# Patient Record
Sex: Female | Born: 2000 | Race: Black or African American | Hispanic: No | Marital: Single | Smoking: Never smoker
Health system: Southern US, Community
[De-identification: ages and names within clinical notes are randomized; demographics above are authoritative.]

---

## 2020-01-21 ENCOUNTER — Other Ambulatory Visit: Payer: Self-pay

## 2020-01-21 ENCOUNTER — Emergency Department (HOSPITAL_COMMUNITY): Payer: HRSA Program

## 2020-01-21 ENCOUNTER — Emergency Department (HOSPITAL_COMMUNITY)
Admission: EM | Admit: 2020-01-21 | Discharge: 2020-01-21 | Disposition: A | Discharge status: Home / Self Care | Payer: HRSA Program | Attending: Emergency Medicine | Admitting: Emergency Medicine

## 2020-01-21 ENCOUNTER — Encounter (HOSPITAL_COMMUNITY): Payer: Self-pay | Admitting: *Deleted

## 2020-01-21 DIAGNOSIS — R112 Nausea with vomiting, unspecified: Secondary | ICD-10-CM | POA: Insufficient documentation

## 2020-01-21 DIAGNOSIS — R05 Cough: Secondary | ICD-10-CM | POA: Insufficient documentation

## 2020-01-21 DIAGNOSIS — U071 COVID-19: Secondary | ICD-10-CM | POA: Diagnosis not present

## 2020-01-21 DIAGNOSIS — R531 Weakness: Secondary | ICD-10-CM | POA: Insufficient documentation

## 2020-01-21 DIAGNOSIS — R0600 Dyspnea, unspecified: Secondary | ICD-10-CM | POA: Insufficient documentation

## 2020-01-21 DIAGNOSIS — R509 Fever, unspecified: Secondary | ICD-10-CM | POA: Insufficient documentation

## 2020-01-21 DIAGNOSIS — R079 Chest pain, unspecified: Secondary | ICD-10-CM | POA: Diagnosis present

## 2020-01-21 LAB — I-STAT BETA HCG BLOOD, ED (MC, WL, AP ONLY): I-stat hCG, quantitative: <5 m[IU]/mL (ref <5)

## 2020-01-21 LAB — CBC WITH DIFFERENTIAL/PLATELET
Abs Immature Granulocytes: 0.05 10*3/uL (ref 0.00–0.07)
Basophils Absolute: 0 10*3/uL (ref 0.0–0.1)
Basophils Relative: 0 %
Eosinophils Absolute: 0 10*3/uL (ref 0.0–0.5)
Eosinophils Relative: 0 %
HCT: 40.7 % (ref 36.0–46.0)
Hemoglobin: 13.8 g/dL (ref 12.0–15.0)
Immature Granulocytes: 1 %
Lymphocytes Relative: 11 %
Lymphs Abs: 1 10*3/uL (ref 0.7–4.0)
MCH: 29.7 pg (ref 26.0–34.0)
MCHC: 33.9 g/dL (ref 30.0–36.0)
MCV: 87.7 fL (ref 80.0–100.0)
Monocytes Absolute: 0.4 10*3/uL (ref 0.1–1.0)
Monocytes Relative: 4 %
Neutro Abs: 8.2 10*3/uL — ABNORMAL HIGH (ref 1.7–7.7)
Neutrophils Relative %: 84 %
Platelets: 189 10*3/uL (ref 150–400)
RBC: 4.64 MIL/uL (ref 3.87–5.11)
RDW: 11.9 % (ref 11.5–15.5)
WBC: 9.7 10*3/uL (ref 4.0–10.5)
nRBC: 0 % (ref 0.0–0.2)

## 2020-01-21 LAB — COMPREHENSIVE METABOLIC PANEL
ALT: 47 U/L — ABNORMAL HIGH (ref 0–44)
AST: 37 U/L (ref 15–41)
Albumin: 3.8 g/dL (ref 3.5–5.0)
Alkaline Phosphatase: 38 U/L (ref 38–126)
Anion gap: 8 (ref 5–15)
BUN: 9 mg/dL (ref 6–20)
CO2: 27 mmol/L (ref 22–32)
Calcium: 8.9 mg/dL (ref 8.9–10.3)
Chloride: 101 mmol/L (ref 98–111)
Creatinine, Ser: 1.05 mg/dL — ABNORMAL HIGH (ref 0.44–1.00)
GFR calc Af Amer: >60 mL/min (ref >60)
GFR calc non Af Amer: >60 mL/min (ref >60)
Glucose, Bld: 105 mg/dL — ABNORMAL HIGH (ref 70–99)
Potassium: 3.5 mmol/L (ref 3.5–5.1)
Sodium: 136 mmol/L (ref 135–145)
Total Bilirubin: 0.4 mg/dL (ref 0.3–1.2)
Total Protein: 7.9 g/dL (ref 6.5–8.1)

## 2020-01-21 LAB — BRAIN NATRIURETIC PEPTIDE: B Natriuretic Peptide: 27.2 pg/mL (ref 0.0–100.0)

## 2020-01-21 MED ORDER — KETOROLAC TROMETHAMINE 15 MG/ML IJ SOLN
15.0000 mg | Freq: Once | INTRAMUSCULAR | Status: AC
  Administered 2020-01-21: 15 mg via INTRAVENOUS
  Filled 2020-01-21: qty 1

## 2020-01-21 MED ORDER — SODIUM CHLORIDE 0.9 % IV BOLUS
1000.0000 mL | Freq: Once | INTRAVENOUS | Status: AC
  Administered 2020-01-21: 1000 mL via INTRAVENOUS

## 2020-01-21 MED ORDER — ACETAMINOPHEN 325 MG PO TABS: 650.0000 mg | ORAL_TABLET | Freq: Once | ORAL | Status: DC | PRN

## 2020-01-21 MED ORDER — IOHEXOL 350 MG/ML SOLN
100.0000 mL | Freq: Once | INTRAVENOUS | Status: AC | PRN
  Administered 2020-01-21: 100 mL via INTRAVENOUS

## 2020-01-21 NOTE — Discharge Instructions (Signed)
As discussed, your evaluation today has been largely reassuring.  But, it is important that you monitor your condition carefully, and do not hesitate to return to the ED if you develop new, or concerning changes in your condition. ? ?Otherwise, please follow-up with your physician for appropriate ongoing care. ? ?

## 2020-01-21 NOTE — ED Triage Notes (Signed)
Pt arrives via [facility] with c/o chest pain. Per report, pt was dx on the 20th COVID SOB/Cough.  2 days ago started  having anterior chest wall pain and productive cough. Vomiting. En route, 142/92, hr 120's, 95-96% RA.

## 2020-01-21 NOTE — ED Provider Notes (Signed)
[facility]-EMERGENCY DEPT Provider Note   CSN: [id number] Arrival date & time: 01/21/20  2003     History Chief Complaint  Patient presents with  . Chest Pain    Monica Melendez is a 19 y.o. female.  HPI    Patient presents concern of chest pain, cough, fever, discomfort, dyspnea. Patient became ill about 1 week ago, was diagnosed positive coronavirus 5 days ago.  She notes that in particular over the past 2 or 3 days she has felt particularly poorly in general, with no relief with OTC medication.  No confusion, disorientation. She does complain of nausea, vomiting, generalized discomfort, chest, with coughing. She was well prior to the onset of illness, denies any medical problems, denies smoking, drinking alcohol.  History reviewed. No pertinent past medical history.  There are no problems to display for this patient.   History reviewed. No pertinent surgical history.   OB History   No obstetric history on file.     No family history on file.  Social History   Tobacco Use  . Smoking status: Not on file  Substance Use Topics  . Alcohol use: Not on file  . Drug use: Not on file    Home Medications Prior to Admission medications   Medication Sig Start Date End Date Taking? Authorizing Provider  acetaminophen (TYLENOL) 500 MG tablet Take 1,000 mg by mouth as needed for moderate pain.   Yes [provider]  dextromethorphan-guaiFENesin (MUCINEX DM) 30-600 MG 12hr tablet Take 1 tablet by mouth 2 (two) times daily.   Yes [provider]    Allergies    Patient has no known allergies.  Review of Systems   Review of Systems  Constitutional:       Per HPI, otherwise negative  HENT:       Per HPI, otherwise negative  Respiratory:       Per HPI, otherwise negative  Cardiovascular:       Per HPI, otherwise negative  Gastrointestinal: Positive for nausea and vomiting.  Endocrine:       Negative aside from HPI    Genitourinary:       Neg aside from HPI   Musculoskeletal:       Per HPI, otherwise negative  Skin: Negative.   Neurological: Positive for weakness. Negative for syncope.    Physical Exam Updated Vital Signs BP 116/88   Pulse 96   Temp (!) 101.2 F (38.4 C) (Oral)   Resp (!) 25   Ht 5\' 7"  (1.702 m)   Wt 131.5 kg   LMP  (LMP Unknown)   SpO2 98%   BMI 45.42 kg/m   Physical Exam Vitals and nursing note reviewed.  Constitutional:      Appearance: She is well-developed. She is obese.  HENT:     Head: Normocephalic and atraumatic.  Eyes:     Conjunctiva/sclera: Conjunctivae normal.  Cardiovascular:     Rate and Rhythm: Regular rhythm. Tachycardia present.  Pulmonary:     Effort: Pulmonary effort is normal. Tachypnea present.     Breath sounds: Normal breath sounds. No decreased breath sounds.  Abdominal:     General: There is no distension.  Skin:    General: Skin is warm and dry.  Neurological:     Mental Status: She is alert and oriented to person, place, and time.     Cranial Nerves: No cranial nerve deficit.     ED Results / Procedures / Treatments   Labs (all  labs ordered are listed, but only abnormal results are displayed) Labs Reviewed  COMPREHENSIVE METABOLIC PANEL - Abnormal; Notable for the following components:      Result Value   Glucose, Bld 105 (*)    Creatinine, Ser 1.05 (*)    ALT 47 (*)    All other components within normal limits  CBC WITH DIFFERENTIAL/PLATELET - Abnormal; Notable for the following components:   Neutro Abs 8.2 (*)    All other components within normal limits  BRAIN NATRIURETIC PEPTIDE  URINALYSIS, ROUTINE W REFLEX MICROSCOPIC  I-STAT BETA HCG BLOOD, ED ([facility], [facility], [facility] ONLY)    EKG None  Radiology CT Angio Chest PE W and/or Wo Contrast  Result Date: 01/21/2020 CLINICAL DATA:  Shortness of breath, history of COVID-19 positivity EXAM: CT ANGIOGRAPHY CHEST WITH CONTRAST TECHNIQUE: Multidetector CT imaging of the chest was  performed using the standard protocol during bolus administration of intravenous contrast. Multiplanar CT image reconstructions and MIPs were obtained to evaluate the vascular anatomy. CONTRAST:  115mL OMNIPAQUE IOHEXOL 350 MG/ML SOLN COMPARISON:  None. FINDINGS: Cardiovascular: Thoracic aorta and its branches are within normal limits. No aneurysmal dilatation is seen. No cardiac enlargement is noted. The pulmonary artery shows no large central filling defect to suggest pulmonary embolism. Mild motion artifact limits evaluation peripherally. Mediastinum/Nodes: Thoracic inlet is within normal limits. Scattered small mediastinal lymph nodes are noted. No sizable adenopathy is seen. The esophagus is within limits. Lungs/Pleura: Lungs are well aerated bilaterally. Patchy ground-glass opacities are noted primarily within the lower lobes but to a lesser degree in the upper lobes consistent with multifocal pneumonia. This is consistent with the given clinical history of COVID-19 positivity. No sizable effusion is seen. No pneumothorax is noted. Upper Abdomen: Visualized upper abdomen is within normal limits. Musculoskeletal: Bony structures show no acute abnormality. Review of the MIP images confirms the above findings. IMPRESSION: No evidence of pulmonary embolism although the evaluation is somewhat limited by patient motion artifact. Patchy ground-glass opacities bilaterally consistent with the given clinical history of COVID-19 positivity. Electronically Signed   By: Inez Catalina M.D.   On: 01/21/2020 21:40   DG Chest Port 1 View  Result Date: 01/21/2020 CLINICAL DATA:  Shortness of breath, COVID-19 positivity EXAM: PORTABLE CHEST 1 VIEW COMPARISON:  None. FINDINGS: Cardiac shadow is within normal limits. The lungs are well aerated bilaterally. Patchy opacities are noted in the bases bilaterally right greater than left similar to that seen on recent CT examination consistent with the given clinical history. No bony  abnormality is noted. IMPRESSION: Bilateral patchy opacities consistent with the given clinical history of COVID-19 positivity. Electronically Signed   By: Inez Catalina M.D.   On: 01/21/2020 21:42    Procedures Procedures (including critical care time)  Medications Ordered in ED Medications  acetaminophen (TYLENOL) tablet 650 mg (has no administration in time range)  sodium chloride 0.9 % bolus 1,000 mL (0 mLs Intravenous Stopped 01/21/20 2252)  ketorolac (TORADOL) 15 MG/ML injection 15 mg (15 mg Intravenous Given 01/21/20 2100)  iohexol (OMNIPAQUE) 350 MG/ML injection 100 mL (100 mLs Intravenous Contrast Given 01/21/20 2118)    ED Course  I have reviewed the triage vital signs and the nursing notes.  Pertinent labs & imaging results that were available during my care of the patient were reviewed by me and considered in my medical decision making (see chart for details).   With consideration of pneumonia versus bacteremia versus sepsis versus hemodynamic instability given her tachycardia, tachypnea, fever, recent Covid  diagnosis, patient will have labs, x-ray, received fluids, IV meds.  11:22 PM Awake, alert, using a cellular telephone.  She has minimal tachycardia, has no evidence for distress, no increased work of breathing. We discussed x-ray, CT, labs.  Findings most consistent with mild dehydration, otherwise reassuring, no evidence for pulmonary embolism, concurrent pneumonia, substantial electrolyte abnormalities, bacteremia, sepsis.  Patient amenable to, appropriate for outpatient follow-up. Final Clinical Impression(s) / ED Diagnoses Final diagnoses:  COVID-19 virus infection     Gerhard Munch, MD 01/21/20 2323

## 2020-01-21 NOTE — ED Triage Notes (Signed)
Patient reporting she was tested at [facility], COVID + on the 20th. Has had shortness of breath, productive cough, feeling weak, fevers and vomiting. Pain all through the chest. Last took tylenol around noon today for fevers.

## 2020-05-27 ENCOUNTER — Emergency Department (HOSPITAL_COMMUNITY): Payer: No Typology Code available for payment source

## 2020-05-27 ENCOUNTER — Emergency Department (HOSPITAL_COMMUNITY)
Admission: EM | Admit: 2020-05-27 | Discharge: 2020-05-28 | Disposition: A | Discharge status: Home / Self Care | Payer: No Typology Code available for payment source | Attending: Emergency Medicine | Admitting: Emergency Medicine

## 2020-05-27 ENCOUNTER — Encounter (HOSPITAL_COMMUNITY): Payer: Self-pay

## 2020-05-27 ENCOUNTER — Other Ambulatory Visit: Payer: Self-pay

## 2020-05-27 DIAGNOSIS — M7918 Myalgia, other site: Secondary | ICD-10-CM | POA: Insufficient documentation

## 2020-05-27 DIAGNOSIS — S99912A Unspecified injury of left ankle, initial encounter: Secondary | ICD-10-CM | POA: Insufficient documentation

## 2020-05-27 DIAGNOSIS — W240XXA Contact with lifting devices, not elsewhere classified, initial encounter: Secondary | ICD-10-CM | POA: Insufficient documentation

## 2020-05-27 DIAGNOSIS — Y990 Civilian activity done for income or pay: Secondary | ICD-10-CM | POA: Diagnosis not present

## 2020-05-27 DIAGNOSIS — Y9269 Other specified industrial and construction area as the place of occurrence of the external cause: Secondary | ICD-10-CM | POA: Diagnosis not present

## 2020-05-27 DIAGNOSIS — Y9389 Activity, other specified: Secondary | ICD-10-CM | POA: Diagnosis not present

## 2020-05-27 NOTE — ED Triage Notes (Signed)
Pt arrives pOV for eval of L sided ankle pain afterit was struck by a forklift while at work today. Ambulatory, but painful.

## 2020-05-28 MED ORDER — IBUPROFEN 400 MG PO TABS
600.0000 mg | ORAL_TABLET | Freq: Once | ORAL | Status: AC
  Administered 2020-05-28: 600 mg via ORAL
  Filled 2020-05-28: qty 1
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
## 2020-05-28 NOTE — Discharge Instructions (Signed)
Thank you for allowing me to care for you today in the Emergency Department.   Take 650 mg of Tylenol or 600 mg of ibuprofen with food every 6 hours for pain.  You can alternate between these 2 medications every 3 hours if your pain returns.  For instance, you can take Tylenol at noon, followed by a dose of ibuprofen at 3, followed by second dose of Tylenol and 6.  Elevate your left leg said your toes are at or above the level of your nose.  Apply an ice pack for 15 to 20 minutes up to 3-4 times a day for the next 5 days.  Wear the Ace wrap to provide some compression to help with pain and swelling.  Use the crutches until you can place weight on your left foot without considerable pain.  If your symptoms not significantly improved in the next week, you can follow-up with Dr. Jena Gauss.  His contact information as above.  Return to the emergency department if you develop significant swelling, redness to the left leg, ankle, or foot, if your toes turn blue, if you have any fall or injury, or other new, concerning symptoms.

## 2020-06-23 ENCOUNTER — Other Ambulatory Visit: Payer: Self-pay

## 2020-06-23 ENCOUNTER — Emergency Department (HOSPITAL_COMMUNITY)
Admission: EM | Admit: 2020-06-23 | Discharge: 2020-06-23 | Disposition: A | Discharge status: Home / Self Care | Payer: Self-pay | Attending: Emergency Medicine | Admitting: Emergency Medicine

## 2020-06-23 ENCOUNTER — Encounter (HOSPITAL_COMMUNITY): Payer: Self-pay | Admitting: Pediatrics

## 2020-06-23 DIAGNOSIS — Y92410 Unspecified street and highway as the place of occurrence of the external cause: Secondary | ICD-10-CM | POA: Insufficient documentation

## 2020-06-23 DIAGNOSIS — M545 Low back pain, unspecified: Secondary | ICD-10-CM

## 2020-06-23 NOTE — Discharge Instructions (Signed)
Return if any problems.
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
## 2020-06-23 NOTE — ED Notes (Signed)
AVS reviewed with pt who verbalized understanding. Pt ambulatory out of dept. °

## 2020-08-27 ENCOUNTER — Encounter (HOSPITAL_COMMUNITY): Payer: Self-pay | Admitting: Emergency Medicine

## 2020-08-27 ENCOUNTER — Other Ambulatory Visit: Payer: Self-pay

## 2020-08-27 DIAGNOSIS — R1013 Epigastric pain: Secondary | ICD-10-CM | POA: Insufficient documentation

## 2020-08-27 DIAGNOSIS — R1012 Left upper quadrant pain: Secondary | ICD-10-CM | POA: Insufficient documentation

## 2020-08-27 DIAGNOSIS — R197 Diarrhea, unspecified: Secondary | ICD-10-CM | POA: Insufficient documentation

## 2020-08-27 DIAGNOSIS — R1011 Right upper quadrant pain: Secondary | ICD-10-CM | POA: Insufficient documentation

## 2020-08-27 DIAGNOSIS — R1084 Generalized abdominal pain: Secondary | ICD-10-CM | POA: Insufficient documentation

## 2020-08-27 LAB — LIPASE, BLOOD: Lipase: 86 U/L — ABNORMAL HIGH (ref 11–51)

## 2020-08-27 LAB — COMPREHENSIVE METABOLIC PANEL
ALT: 20 U/L (ref 0–44)
AST: 13 U/L — ABNORMAL LOW (ref 15–41)
Albumin: 4.2 g/dL (ref 3.5–5.0)
Alkaline Phosphatase: 44 U/L (ref 38–126)
Anion gap: 9 (ref 5–15)
BUN: 13 mg/dL (ref 6–20)
CO2: 26 mmol/L (ref 22–32)
Calcium: 9.2 mg/dL (ref 8.9–10.3)
Chloride: 104 mmol/L (ref 98–111)
Creatinine, Ser: 0.79 mg/dL (ref 0.44–1.00)
GFR, Estimated: >60 mL/min (ref >60)
Glucose, Bld: 104 mg/dL — ABNORMAL HIGH (ref 70–99)
Potassium: 3.5 mmol/L (ref 3.5–5.1)
Sodium: 139 mmol/L (ref 135–145)
Total Bilirubin: 0.5 mg/dL (ref 0.3–1.2)
Total Protein: 7.4 g/dL (ref 6.5–8.1)

## 2020-08-27 LAB — CBC
HCT: 36.4 % (ref 36.0–46.0)
Hemoglobin: 12.3 g/dL (ref 12.0–15.0)
MCH: 30.5 pg (ref 26.0–34.0)
MCHC: 33.8 g/dL (ref 30.0–36.0)
MCV: 90.3 fL (ref 80.0–100.0)
Platelets: 255 10*3/uL (ref 150–400)
RBC: 4.03 MIL/uL (ref 3.87–5.11)
RDW: 12 % (ref 11.5–15.5)
WBC: 8.7 10*3/uL (ref 4.0–10.5)
nRBC: 0 % (ref 0.0–0.2)

## 2020-08-27 LAB — URINALYSIS, ROUTINE W REFLEX MICROSCOPIC
Bilirubin Urine: NEGATIVE
Glucose, UA: NEGATIVE mg/dL
Hgb urine dipstick: NEGATIVE
Ketones, ur: NEGATIVE mg/dL
Leukocytes,Ua: NEGATIVE
Nitrite: NEGATIVE
Protein, ur: NEGATIVE mg/dL
Specific Gravity, Urine: 1.013 (ref 1.005–1.030)
pH: 5 (ref 5.0–8.0)

## 2020-08-27 LAB — I-STAT BETA HCG BLOOD, ED (MC, WL, AP ONLY): I-stat hCG, quantitative: <5 m[IU]/mL (ref <5)

## 2020-08-27 NOTE — ED Notes (Signed)
Pt provided labeled specimen cup for urine collection per MD order. [employer]

## 2020-08-27 NOTE — ED Triage Notes (Addendum)
Pt c/o abdominal pain x 1 week. Describes pain as stabbing. Denies N/V, fevers, chills, body aches, chest pain, shob. Endorses diarrhea. Last BM 12/1. Pt thinks she has food poisoning.

## 2020-08-28 ENCOUNTER — Emergency Department (HOSPITAL_COMMUNITY)
Admission: EM | Admit: 2020-08-28 | Discharge: 2020-08-28 | Disposition: A | Discharge status: Home / Self Care | Payer: Self-pay | Attending: Emergency Medicine | Admitting: Emergency Medicine

## 2020-08-28 ENCOUNTER — Emergency Department (HOSPITAL_COMMUNITY): Payer: Self-pay

## 2020-08-28 ENCOUNTER — Encounter (HOSPITAL_COMMUNITY): Payer: Self-pay | Admitting: Radiology

## 2020-08-28 DIAGNOSIS — R1011 Right upper quadrant pain: Secondary | ICD-10-CM

## 2020-08-28 DIAGNOSIS — R1084 Generalized abdominal pain: Secondary | ICD-10-CM

## 2020-08-28 LAB — RAPID URINE DRUG SCREEN, HOSP PERFORMED
Amphetamines: NOT DETECTED
Barbiturates: NOT DETECTED
Benzodiazepines: NOT DETECTED
Cocaine: NOT DETECTED
Opiates: NOT DETECTED
Tetrahydrocannabinol: NOT DETECTED

## 2020-08-28 MED ORDER — IOHEXOL 300 MG/ML  SOLN
100.0000 mL | Freq: Once | INTRAMUSCULAR | Status: AC | PRN
  Administered 2020-08-28: 100 mL via INTRAVENOUS

## 2020-08-28 MED ORDER — SODIUM CHLORIDE 0.9 % IV BOLUS
1000.0000 mL | Freq: Once | INTRAVENOUS | Status: AC
  Administered 2020-08-28: 1000 mL via INTRAVENOUS

## 2020-08-28 MED ORDER — LIDOCAINE VISCOUS HCL 2 % MT SOLN
15.0000 mL | Freq: Once | OROMUCOSAL | Status: AC
  Administered 2020-08-28: 15 mL via ORAL
  Filled 2020-08-28: qty 15

## 2020-08-28 MED ORDER — DICYCLOMINE HCL 10 MG PO CAPS
10.0000 mg | ORAL_CAPSULE | Freq: Once | ORAL | Status: AC
  Administered 2020-08-28: 10 mg via ORAL
  Filled 2020-08-28: qty 1

## 2020-08-28 MED ORDER — DICYCLOMINE HCL 20 MG PO TABS: 20.0000 mg | ORAL_TABLET | Freq: Two times a day (BID) | ORAL | 0 refills | Status: AC

## 2020-08-28 MED ORDER — ONDANSETRON 4 MG PO TBDP: 4.0000 mg | ORAL_TABLET | Freq: Three times a day (TID) | ORAL | 0 refills | Status: AC | PRN

## 2020-08-28 MED ORDER — SODIUM CHLORIDE (PF) 0.9 % IJ SOLN
INTRAMUSCULAR | Status: AC
  Filled 2020-08-28: qty 50

## 2020-08-28 MED ORDER — ALUM & MAG HYDROXIDE-SIMETH 200-200-20 MG/5ML PO SUSP
30.0000 mL | Freq: Once | ORAL | Status: AC
  Administered 2020-08-28: 30 mL via ORAL
  Filled 2020-08-28: qty 30

## 2020-08-28 MED ORDER — OMEPRAZOLE 20 MG PO CPDR: 20.0000 mg | DELAYED_RELEASE_CAPSULE | Freq: Every day | ORAL | 0 refills | Status: AC

## 2020-08-28 MED ORDER — FENTANYL CITRATE (PF) 100 MCG/2ML IJ SOLN
25.0000 ug | Freq: Once | INTRAMUSCULAR | Status: AC
  Administered 2020-08-28: 25 ug via INTRAVENOUS
  Filled 2020-08-28: qty 2

## 2020-08-28 NOTE — Discharge Instructions (Addendum)
Your work-up today was overall reassuring.  I suspect that your symptoms may be due to a stomach bug, or could be due to gastric reflux.  Please adhere to the bland diet, and slowly increase your food intake for your diarrhea.  You may also take over-the-counter Imodium for your symptoms.  Have also prescribed you a PPI which can treat gastric reflux.  Please follow-up with your primary care doctor.  If you do not have one, I have provided the contact information for [facility] which is a free clinic here in the area.  Please return to the ER for any new or worsening symptoms.

## 2020-08-28 NOTE — ED Provider Notes (Addendum)
[facility]-EMERGENCY DEPT Provider Note   CSN: [id number] Arrival date & time: 08/27/20  1832     History Chief Complaint  Patient presents with  . Abdominal Pain  . Diarrhea    Monica Melendez is a 18 y.o. female.  HPI  19 year-old female with no significant medical history presents to the ER with complaints of upper abdominal pain which has been ongoing since about Thanksgiving.  States the pain is epigastric, comes and goes, sharp and stabbing.  States that it is sometimes in her left upper quadrant.  She has not been able to tolerate much food or water.  She also endorses some watery, nonbloody diarrhea.  Denies any dysuria, hematuria.  No back pain.  Denies any vaginal discharge or odors or bleeding.  Denies any recent drug or alcohol use.  No fevers or chills.  No nausea or vomiting.    History reviewed. No pertinent past medical history.  There are no problems to display for this patient.   History reviewed. No pertinent surgical history.   OB History   No obstetric history on file.     No family history on file.  Social History   Tobacco Use  . Smoking status: Not on file  Substance Use Topics  . Alcohol use: Not on file  . Drug use: Not on file    Home Medications Prior to Admission medications   Medication Sig Start Date End Date Taking? Authorizing Provider  acetaminophen (TYLENOL) 500 MG tablet Take 1,000 mg by mouth as needed for moderate pain.   Yes [provider]  dicyclomine (BENTYL) 20 MG tablet Take 1 tablet (20 mg total) by mouth 2 (two) times daily. 08/28/20   Mare Ferrari, PA-C  omeprazole (PRILOSEC) 20 MG capsule Take 1 capsule (20 mg total) by mouth daily. 08/28/20   Mare Ferrari, PA-C  ondansetron (ZOFRAN ODT) 4 MG disintegrating tablet Take 1 tablet (4 mg total) by mouth every 8 (eight) hours as needed for nausea or vomiting. 08/28/20   Mare Ferrari, PA-C    Allergies    Patient has no known  allergies.  Review of Systems   Review of Systems  Constitutional: Negative for chills and fever.  HENT: Negative for ear pain and sore throat.   Eyes: Negative for pain and visual disturbance.  Respiratory: Negative for cough and shortness of breath.   Cardiovascular: Negative for chest pain and palpitations.  Gastrointestinal: Positive for abdominal pain and diarrhea. Negative for abdominal distention, nausea and vomiting.  Genitourinary: Negative for dysuria and hematuria.  Musculoskeletal: Negative for arthralgias and back pain.  Skin: Negative for color change and rash.  Neurological: Negative for seizures and syncope.  All other systems reviewed and are negative.   Physical Exam Updated Vital Signs BP (!) 133/95   Pulse 64   Temp 98 F (36.7 C) (Oral)   Resp (!) 9   Ht 5\' 7"  (1.702 m)   Wt 119.3 kg   LMP 08/20/2020 Comment: negative HCG blood test 08-28-2020  SpO2 100%   BMI 41.19 kg/m   Physical Exam Vitals and nursing note reviewed.  Constitutional:      General: She is not in acute distress.    Appearance: She is well-developed. She is obese. She is not ill-appearing or diaphoretic.  HENT:     Head: Normocephalic and atraumatic.  Eyes:     Conjunctiva/sclera: Conjunctivae normal.  Cardiovascular:     Rate and Rhythm: Normal rate and  regular rhythm.     Heart sounds: Normal heart sounds. No murmur heard.   Pulmonary:     Effort: Pulmonary effort is normal. No respiratory distress.     Breath sounds: Normal breath sounds.  Abdominal:     General: Abdomen is flat.     Palpations: Abdomen is soft.     Tenderness: There is abdominal tenderness in the right upper quadrant, epigastric area and left upper quadrant. There is no right CVA tenderness, left CVA tenderness, guarding or rebound. Positive signs include Murphy's sign. Negative signs include Rovsing's sign, McBurney's sign and obturator sign.  Musculoskeletal:     Cervical back: Neck supple.  Skin:     General: Skin is warm.  Neurological:     General: No focal deficit present.     Mental Status: She is alert.  Psychiatric:        Mood and Affect: Mood normal.        Behavior: Behavior normal.     ED Results / Procedures / Treatments   Labs (all labs ordered are listed, but only abnormal results are displayed) Labs Reviewed  LIPASE, BLOOD - Abnormal; Notable for the following components:      Result Value   Lipase 86 (*)    All other components within normal limits  COMPREHENSIVE METABOLIC PANEL - Abnormal; Notable for the following components:   Glucose, Bld 104 (*)    AST 13 (*)    All other components within normal limits  CBC  URINALYSIS, ROUTINE W REFLEX MICROSCOPIC  RAPID URINE DRUG SCREEN, HOSP PERFORMED  I-STAT BETA HCG BLOOD, ED ([facility], [facility], [facility] ONLY)    EKG None  Radiology CT ABDOMEN PELVIS W CONTRAST  Result Date: 08/28/2020 CLINICAL DATA:  Acute epigastric abdominal pain. EXAM: CT ABDOMEN AND PELVIS WITH CONTRAST TECHNIQUE: Multidetector CT imaging of the abdomen and pelvis was performed using the standard protocol following bolus administration of intravenous contrast. CONTRAST:  OMNIPAQUE IOHEXOL 300 MG/ML  SOLN COMPARISON:  None. FINDINGS: Lower chest: No acute abnormality. Hepatobiliary: No focal liver abnormality is seen. No gallstones, gallbladder wall thickening, or biliary dilatation. Pancreas: Unremarkable. No pancreatic ductal dilatation or surrounding inflammatory changes. Spleen: Normal in size without focal abnormality. Adrenals/Urinary Tract: Adrenal glands are unremarkable. Kidneys are normal, without renal calculi, focal lesion, or hydronephrosis. Bladder is unremarkable. Stomach/Bowel: Stomach is within normal limits. Appendix appears normal. No evidence of bowel wall thickening, distention, or inflammatory changes. Vascular/Lymphatic: No significant vascular findings are present. No enlarged abdominal or pelvic lymph nodes. Reproductive: Uterus and  bilateral adnexa are unremarkable. Other: No abdominal wall hernia or abnormality. No abdominopelvic ascites. Musculoskeletal: No acute or significant osseous findings. IMPRESSION: No abnormality seen in the abdomen or pelvis. Electronically Signed   By: Lupita Raider M.D.   On: 08/28/2020 08:49   US Abdomen Limited RUQ (LIVER/GB)  Result Date: 08/28/2020 CLINICAL DATA:  Right upper quadrant and diffuse abdominal pain EXAM: ULTRASOUND ABDOMEN LIMITED RIGHT UPPER QUADRANT COMPARISON:  Abdomen and pelvis CT from earlier today. FINDINGS: Gallbladder: No gallstones or wall thickening visualized. No sonographic Murphy sign noted by sonographer. Common bile duct: Diameter: 3 mm Liver: Prominent liver echogenicity but no diminished acoustic penetration or visible sparing. No definite steatosis by CT either. No focal lesion. Portal vein is patent on color Doppler imaging with normal direction of blood flow towards the liver. IMPRESSION: Negative right upper quadrant ultrasound. Electronically Signed   By: Marnee Spring M.D.   On: 08/28/2020 11:40  Procedures Procedures (including critical care time)  Medications Ordered in ED Medications  alum & mag hydroxide-simeth (MAALOX/MYLANTA) 200-200-20 MG/5ML suspension 30 mL (has no administration in time range)    And  lidocaine (XYLOCAINE) 2 % viscous mouth solution 15 mL (has no administration in time range)  sodium chloride 0.9 % bolus 1,000 mL (0 mLs Intravenous Stopped 08/28/20 0841)  fentaNYL (SUBLIMAZE) injection 25 mcg (25 mcg Intravenous Given 08/28/20 0720)  sodium chloride (PF) 0.9 % injection (  Given by Other 08/28/20 0807)  iohexol (OMNIPAQUE) 300 MG/ML solution 100 mL (100 mLs Intravenous Contrast Given 08/28/20 0809)  dicyclomine (BENTYL) capsule 10 mg (10 mg Oral Given 08/28/20 1146)    ED Course  I have reviewed the triage vital signs and the nursing notes.  Pertinent labs & imaging results that were available during my care of the  patient were reviewed by me and considered in my medical decision making (see chart for details).    MDM Rules/Calculators/A&P                          19 year old female with complaints of upper abdominal pain x1 week. Presentation, she is alert and oriented, nontoxic-appearing, no acute distress, resting comfortably in ER bed.  Vitals on arrival overall reassuring and unremarkable.  Physical exam with positive Murphy sign, also with left upper quadrant tenderness and epigastric tenderness.  She has no lower abdominal tenderness, no flank pain.  Labs ordered in triage, reviewed by myself -CBC without leukocytosis -CMP without any electrolyte abnormalities, normal renal function.  AST slightly decreased at 13. -Lipase of 86 -Negative hCG -  DDx includes cholecystitis, pancreatitis, viral gastroenteritis, sepsis syndrome  MDM: Given positive Murphy sign, right upper quadrant tenderness, will order CT of the abdomen to rule out cholecystitis or pancreatitis.  Patient was given fentanyl for pain, will get 1 L of fluids.  No evidence of pancreatitis as the lipase is not 3 times greater than baseline.  CT of the abdomen and right upper quadrant ultrasound without evidence of abnormality.  Patient was given Bentyl and GI cocktail for abdominal pain.  On reevaluation, patient notes improvement in pain.  Tolerated p.o. fluids well.  Suspect viral component or GERD.Low suspicion for torsion, TOA, PID as the patient has no lower abdominal pain.   We will send home with Bentyl, Zofran, Prilosec.  Return precautions discussed.  Encouraged PCP follow-up.  Encouraged bland diet. She voiced understanding and is agreeable.    At this stage in the ED course, the patient is medically screened and stable for discharge.  Final Clinical Impression(s) / ED Diagnoses Final diagnoses:  RUQ abdominal pain  Generalized abdominal pain    Rx / DC Orders ED Discharge Orders         Ordered    ondansetron (ZOFRAN  ODT) 4 MG disintegrating tablet  Every 8 hours PRN        08/28/20 1200    dicyclomine (BENTYL) 20 MG tablet  2 times daily        08/28/20 1200    omeprazole (PRILOSEC) 20 MG capsule  Daily        08/28/20 1200               Melene Plan, DO 08/28/20 1205    Mare Ferrari, PA-C 08/28/20 1228    Melene Plan, DO 08/28/20 1239

## 2020-08-28 NOTE — ED Notes (Signed)
Pt to CT

## 2020-08-28 NOTE — ED Notes (Signed)
Pt ambulated to bathroom 

## 2020-08-28 NOTE — ED Notes (Signed)
Informed pt that we need a urine sample 

## 2021-10-22 ENCOUNTER — Encounter (HOSPITAL_COMMUNITY): Payer: Self-pay

## 2021-10-22 ENCOUNTER — Emergency Department (HOSPITAL_COMMUNITY)
Admission: EM | Admit: 2021-10-22 | Discharge: 2021-10-22 | Disposition: A | Discharge status: Home / Self Care | Payer: No Typology Code available for payment source | Attending: Emergency Medicine | Admitting: Emergency Medicine

## 2021-10-22 ENCOUNTER — Emergency Department (HOSPITAL_COMMUNITY): Payer: No Typology Code available for payment source

## 2021-10-22 ENCOUNTER — Other Ambulatory Visit: Payer: Self-pay

## 2021-10-22 DIAGNOSIS — S4991XA Unspecified injury of right shoulder and upper arm, initial encounter: Secondary | ICD-10-CM | POA: Insufficient documentation

## 2021-10-22 DIAGNOSIS — R0789 Other chest pain: Secondary | ICD-10-CM | POA: Insufficient documentation

## 2021-10-22 DIAGNOSIS — S6991XA Unspecified injury of right wrist, hand and finger(s), initial encounter: Secondary | ICD-10-CM | POA: Insufficient documentation

## 2021-10-22 DIAGNOSIS — M79644 Pain in right finger(s): Secondary | ICD-10-CM

## 2021-10-22 DIAGNOSIS — M79601 Pain in right arm: Secondary | ICD-10-CM

## 2021-10-22 MED ORDER — METHOCARBAMOL 500 MG PO TABS: 500.0000 mg | ORAL_TABLET | Freq: Three times a day (TID) | ORAL | 0 refills | Status: AC | PRN

## 2021-10-22 MED ORDER — IBUPROFEN 600 MG PO TABS: 600.0000 mg | ORAL_TABLET | Freq: Four times a day (QID) | ORAL | 0 refills | Status: AC | PRN
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
